# Patient Record
Sex: Male | Born: 1985
Health system: Southern US, Community
[De-identification: ages and names within clinical notes are randomized; demographics above are authoritative.]

## PROBLEM LIST (undated history)

## (undated) DIAGNOSIS — B1920 Unspecified viral hepatitis C without hepatic coma: Secondary | ICD-10-CM

## (undated) DIAGNOSIS — F1111 Opioid abuse, in remission: Secondary | ICD-10-CM

---

## 2017-08-22 ENCOUNTER — Emergency Department (HOSPITAL_BASED_OUTPATIENT_CLINIC_OR_DEPARTMENT_OTHER)
Admission: EM | Admit: 2017-08-22 | Discharge: 2017-08-22 | Disposition: A | Discharge status: Home / Self Care | Payer: Self-pay | Attending: Emergency Medicine | Admitting: Emergency Medicine

## 2017-08-22 ENCOUNTER — Other Ambulatory Visit: Payer: Self-pay

## 2017-08-22 ENCOUNTER — Encounter (HOSPITAL_BASED_OUTPATIENT_CLINIC_OR_DEPARTMENT_OTHER): Payer: Self-pay | Admitting: *Deleted

## 2017-08-22 DIAGNOSIS — K0889 Other specified disorders of teeth and supporting structures: Secondary | ICD-10-CM | POA: Insufficient documentation

## 2017-08-22 DIAGNOSIS — F17200 Nicotine dependence, unspecified, uncomplicated: Secondary | ICD-10-CM | POA: Insufficient documentation

## 2017-08-22 MED ORDER — PENICILLIN V POTASSIUM 500 MG PO TABS: 500.0000 mg | ORAL_TABLET | Freq: Three times a day (TID) | ORAL | 0 refills | Status: AC

## 2017-08-22 MED FILL — PENICILLIN VK 500 MG TABLET: 500 | 7 days supply | Qty: 21 | Fill #0

## 2017-08-22 NOTE — ED Triage Notes (Signed)
Pt reports right lower dental pain for years, worse since last night, "stabbing pain". Denies fevers or any other c/o

## 2017-08-22 NOTE — ED Provider Notes (Signed)
[facility] EMERGENCY DEPARTMENT Provider Note   CSN: [id number] Arrival date & time: 08/22/17  1023     History   Chief Complaint Chief Complaint  Patient presents with  . Dental Pain    HPI Darin Larson is a 32 y.o. male.  Patient presents with complaint of worsening dental pain starting last night.  Pain is much improved with ibuprofen.  Patient denies any facial swelling, difficulty breathing, or difficulty swallowing.  Pain radiates to the ear.  No fevers.  Patient is currently in rehab.  He has had dental pain in this area before, intermittently over the past several years.  He has a history of dental extractions.     History reviewed. No pertinent past medical history.  Patient Active Problem List   Diagnosis Date Noted  . Pain, dental 08/22/2017    History reviewed. No pertinent surgical history.      Home Medications    Prior to Admission medications   Medication Sig Start Date End Date Taking? Authorizing Provider  penicillin v potassium (VEETID) 500 MG tablet Take 1 tablet (500 mg total) by mouth 3 (three) times daily. 08/22/17   Renne CriglerGeiple, Promiss Labarbera, PA-C    Family History History reviewed. No pertinent family history.  Social History Social History   Tobacco Use  . Smoking status: Current Every Day Smoker  . Smokeless tobacco: Never Used  Substance Use Topics  . Alcohol use: Not on file  . Drug use: Not on file     Allergies   Patient has no known allergies.   Review of Systems Review of Systems  Constitutional: Negative for fever.  HENT: Positive for dental problem and ear pain. Negative for facial swelling, sore throat and trouble swallowing.   Respiratory: Negative for shortness of breath and stridor.   Musculoskeletal: Negative for neck pain.  Skin: Negative for color change.  Neurological: Negative for headaches.     Physical Exam Updated Vital Signs BP 116/73 (BP Location: Left Arm)   Pulse (!) 57   Temp 98.1 F  (36.7 C) (Oral)   Resp 18   Ht 5\' 11"  (1.803 m)   Wt 83.9 kg   SpO2 100%   BMI 25.80 kg/m   Physical Exam  Constitutional: He appears well-developed and well-nourished.  HENT:  Head: Normocephalic and atraumatic.  Right Ear: Tympanic membrane, external ear and ear canal normal.  Left Ear: Tympanic membrane, external ear and ear canal normal.  Nose: Nose normal.  Mouth/Throat: Uvula is midline, oropharynx is clear and moist and mucous membranes are normal. No trismus in the jaw. Abnormal dentition. Dental caries present. No dental abscesses or uvula swelling. No tonsillar abscesses.  Patient with dental carry noted in tooth #31.  There is associated mild swelling and erythema of the gumline without gross abscess.  Eyes: Pupils are equal, round, and reactive to light.  Neck: Normal range of motion. Neck supple.  No neck swelling or Lugwig's angina  Neurological: He is alert.  Skin: Skin is warm and dry.  Psychiatric: He has a normal mood and affect.  Nursing note and vitals reviewed.    ED Treatments / Results  Labs (all labs ordered are listed, but only abnormal results are displayed) Labs Reviewed - No data to display  EKG None  Radiology No results found.  Procedures Procedures (including critical care time)  Medications Ordered in ED Medications - No data to display   Initial Impression / Assessment and Plan / ED Course  I have  reviewed the triage vital signs and the nursing notes.  Pertinent labs & imaging results that were available during my care of the patient were reviewed by me and considered in my medical decision making (see chart for details).     11:25 AM Patient seen and examined. Will start on penicillin. No allergy.   Vital signs reviewed and are as follows: BP 116/73 (BP Location: Left Arm)   Pulse (!) 57   Temp 98.1 F (36.7 C) (Oral)   Resp 18   Ht 5\' 11"  (1.803 m)   Wt 83.9 kg   SpO2 100%   BMI 25.80 kg/m   Patient counseled to  take prescribed medications as directed, return with worsening facial or neck swelling, and to follow-up with their dentist as soon as possible.    Final Clinical Impressions(s) / ED Diagnoses   Final diagnoses:  Toothache   Patient with toothache. No fever. Exam unconcerning for Ludwig's angina or other deep tissue infection in neck.   As there is gum swelling, erythema will treat with antibiotic. Urged patient to follow-up with dentist.     ED Discharge Orders         Ordered    penicillin v potassium (VEETID) 500 MG tablet  3 times daily     08/22/17 1118           Renne CriglerGeiple, Randee Upchurch, PA-C 08/22/17 1125    Sabas SousBero, Michael M, MD 08/22/17 1220

## 2017-08-22 NOTE — Discharge Instructions (Signed)
Please read and follow all provided instructions.  Your diagnoses today include:  1. Toothache    The exam and treatment you received today has been provided on an emergency basis only. This is not a substitute for complete medical or dental care.  Tests performed today include:  Vital signs. See below for your results today.   Medications prescribed:   Penicillin - antibiotic  You have been prescribed an antibiotic medicine: take the entire course of medicine even if you are feeling better. Stopping early can cause the antibiotic not to work.  Take any prescribed medications only as directed.  Home care instructions:  Follow any educational materials contained in this packet.  Follow-up instructions: Please follow-up with your dentist for further evaluation of your symptoms.   Dental Assistance: See attached dental referrals  Return instructions:   Please return to the Emergency Department if you experience worsening symptoms.  Please return if you develop a fever, you develop more swelling in your face or neck, you have trouble breathing or swallowing food.  Please return if you have any other emergent concerns.  Additional Information:  Your vital signs today were: BP 116/73 (BP Location: Left Arm)    Pulse (!) 57    Temp 98.1 F (36.7 C) (Oral)    Resp 18    Ht 5\' 11"  (1.803 m)    Wt 83.9 kg    SpO2 100%    BMI 25.80 kg/m  If your blood pressure (BP) was elevated above 135/85 this visit, please have this repeated by your doctor within one month. --------------

## 2018-04-22 ENCOUNTER — Ambulatory Visit: Payer: Self-pay | Admitting: Family Medicine

## 2018-07-17 ENCOUNTER — Other Ambulatory Visit: Payer: Self-pay

## 2018-07-17 ENCOUNTER — Emergency Department (HOSPITAL_COMMUNITY)
Admission: EM | Admit: 2018-07-17 | Discharge: 2018-07-18 | Disposition: A | Discharge status: Home / Self Care | Payer: Self-pay | Attending: Emergency Medicine | Admitting: Emergency Medicine

## 2018-07-17 DIAGNOSIS — L0291 Cutaneous abscess, unspecified: Secondary | ICD-10-CM

## 2018-07-17 DIAGNOSIS — L739 Follicular disorder, unspecified: Secondary | ICD-10-CM

## 2018-07-17 DIAGNOSIS — L662 Folliculitis decalvans: Secondary | ICD-10-CM | POA: Insufficient documentation

## 2018-07-17 DIAGNOSIS — L02511 Cutaneous abscess of right hand: Secondary | ICD-10-CM | POA: Insufficient documentation

## 2018-07-17 DIAGNOSIS — F17210 Nicotine dependence, cigarettes, uncomplicated: Secondary | ICD-10-CM | POA: Insufficient documentation

## 2018-07-17 MED ORDER — LIDOCAINE HCL 2 % IJ SOLN
10.0000 mL | Freq: Once | INTRAMUSCULAR | Status: AC
  Administered 2018-07-18: 200 mg
  Filled 2018-07-17: qty 20

## 2018-07-17 NOTE — ED Triage Notes (Signed)
Pt arrived stating he has been fishing and roughly 4-5 days day ago noticed a rash on his hand, it has become hard and hot to touch. 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 Pt has red streak moving up arm from wound.

## 2018-07-17 NOTE — ED Notes (Signed)
I&D at bedside for provider

## 2018-07-17 NOTE — ED Provider Notes (Signed)
[facility] DEPT Provider Note   CSN: [id number] Arrival date & time: 07/17/18  1932    History   Chief Complaint Chief Complaint  Patient presents with  . Rash    Possible MRSA    HPI Darin Larson is a 33 y.o. male.     Patient presents with complaints of a rash on his left hand and wrist.  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  He started getting small bumps on his hand which became larger, more red and swollen.  He reports that he has to wear gloves when he has blistering and this irritates his hands.  Over the last 24 hours an area on his left wrist has become very swollen, tender and hard.  This evening he started to notice some red streaking going up his forearm.  He has not had any fever.     No past medical history on file.  Patient Active Problem List   Diagnosis Date Noted  . Pain, dental 08/22/2017    No past surgical history on file.      Home Medications    Prior to Admission medications   Medication Sig Start Date End Date Taking? Authorizing Provider  penicillin v potassium (VEETID) 500 MG tablet Take 1 tablet (500 mg total) by mouth 3 (three) times daily. 08/22/17   Carlisle Cater, PA-C  sulfamethoxazole-trimethoprim (BACTRIM DS) 800-160 MG tablet Take 1 tablet by mouth 2 (two) times daily. 07/18/18   Orpah Greek, MD    Family History No family history on file.  Social History Social History   Tobacco Use  . Smoking status: Current Every Day Smoker  . Smokeless tobacco: Never Used  Substance Use Topics  . Alcohol use: Not on file  . Drug use: Not on file     Allergies   Patient has no known allergies.   Review of Systems Review of Systems  Constitutional: Negative for fever.  Skin: Positive for rash.     Physical Exam  Updated Vital Signs BP (!) 157/94   Pulse 79   Temp 98.8 F (37.1 C) (Oral)   Resp 15   SpO2 98%   Physical Exam Constitutional:      Appearance: Normal appearance.  HENT:     Head: Normocephalic and atraumatic.  Neck:     Musculoskeletal: Neck supple.  Cardiovascular:     Rate and Rhythm: Normal rate and regular rhythm.     Heart sounds: Normal heart sounds.  Pulmonary:     Effort: Pulmonary effort is normal.     Breath sounds: Normal breath sounds.  Abdominal:     Palpations: Abdomen is soft.  Musculoskeletal: Normal range of motion.        General: Swelling (right hand) present.  Skin:    Comments: Multiple papules and pustules on dorsal aspect of right hand. 1.5cm tender nodular eruption ulna aspect of right wrist. Lymphangitis volar aspect of right wrist.  Neurological:     General: No focal deficit present.     Mental Status: He is alert.      ED Treatments / Results  Labs (all labs ordered are listed, but only abnormal results are displayed) Labs Reviewed - No data to display  EKG None  Radiology No results found.  Procedures .Marland KitchenIncision and Drainage  Date/Time: 07/18/2018 12:25 AM Performed by: Gilda CreasePollina,  J, MD Authorized by: Gilda CreasePollina,  J, MD   Consent:    Consent obtained:  Verbal   Consent given by:  Patient   Risks discussed:  Bleeding, incomplete drainage and pain Universal protocol:    Procedure explained and questions answered to patient or proxy's satisfaction: yes     Site/side marked: yes     Immediately prior to procedure a time out was called: yes     Patient identity confirmed:  Verbally with patient Location:    Type:  Abscess   Location:  Upper extremity   Upper extremity location:  Wrist   Wrist location:  R wrist Pre-procedure details:    Skin preparation:  Betadine Anesthesia (see MAR for exact dosages):    Anesthesia method:  Local infiltration   Local anesthetic:  Lidocaine 2% w/o epi Procedure type:     Complexity:  Simple Procedure details:    Needle aspiration: no     Incision types:  Single straight   Scalpel blade:  11   Wound management:  Probed and deloculated   Drainage:  Bloody   Drainage amount:  Scant   Packing materials:  1/4 in iodoform gauze Post-procedure details:    Patient tolerance of procedure:  Tolerated well, no immediate complications   (including critical care time)  Medications Ordered in ED Medications  lidocaine (XYLOCAINE) 2 % (with pres) injection 200 mg (has no administration in time range)  sulfamethoxazole-trimethoprim (BACTRIM DS) 800-160 MG per tablet 2 tablet (has no administration in time range)     Initial Impression / Assessment and Plan / ED Course  I have reviewed the triage vital signs and the nursing notes.  Pertinent labs & imaging results that were available during my care of the patient were reviewed by me and considered in my medical decision making (see chart for details).        Patient with folliculitis on right hand.  There was an abscess on the wrist that was opened here in the ER.  Will initiate p.o. Bactrim.  Final Clinical Impressions(s) / ED Diagnoses   Final diagnoses:  Folliculitis  Abscess    ED Discharge Orders         Ordered    sulfamethoxazole-trimethoprim (BACTRIM DS) 800-160 MG tablet  2 times daily     07/18/18 0028           Gilda CreasePollina,  J, MD 07/18/18 0028

## 2018-07-18 MED ORDER — SULFAMETHOXAZOLE-TRIMETHOPRIM 800-160 MG PO TABS
2.0000 | ORAL_TABLET | Freq: Once | ORAL | Status: AC
  Administered 2018-07-18: 2 via ORAL
  Filled 2018-07-18: qty 2

## 2018-07-18 MED ORDER — SULFAMETHOXAZOLE-TRIMETHOPRIM 800-160 MG PO TABS: 1.0000 | ORAL_TABLET | Freq: Two times a day (BID) | ORAL | 2 refills | Status: AC

## 2019-01-23 ENCOUNTER — Emergency Department (HOSPITAL_COMMUNITY): Payer: PRIVATE HEALTH INSURANCE

## 2019-01-23 ENCOUNTER — Encounter (HOSPITAL_COMMUNITY): Payer: Self-pay | Admitting: Emergency Medicine

## 2019-01-23 ENCOUNTER — Other Ambulatory Visit: Payer: Self-pay

## 2019-01-23 ENCOUNTER — Emergency Department (HOSPITAL_COMMUNITY)
Admission: EM | Admit: 2019-01-23 | Discharge: 2019-01-23 | Disposition: A | Discharge status: Home / Self Care | Payer: PRIVATE HEALTH INSURANCE | Attending: Emergency Medicine | Admitting: Emergency Medicine

## 2019-01-23 DIAGNOSIS — Y9269 Other specified industrial and construction area as the place of occurrence of the external cause: Secondary | ICD-10-CM | POA: Insufficient documentation

## 2019-01-23 DIAGNOSIS — R109 Unspecified abdominal pain: Secondary | ICD-10-CM | POA: Insufficient documentation

## 2019-01-23 DIAGNOSIS — W11XXXA Fall on and from ladder, initial encounter: Secondary | ICD-10-CM | POA: Insufficient documentation

## 2019-01-23 DIAGNOSIS — Z79899 Other long term (current) drug therapy: Secondary | ICD-10-CM | POA: Insufficient documentation

## 2019-01-23 DIAGNOSIS — Y990 Civilian activity done for income or pay: Secondary | ICD-10-CM | POA: Insufficient documentation

## 2019-01-23 DIAGNOSIS — Y93H3 Activity, building and construction: Secondary | ICD-10-CM | POA: Insufficient documentation

## 2019-01-23 DIAGNOSIS — S2241XA Multiple fractures of ribs, right side, initial encounter for closed fracture: Secondary | ICD-10-CM | POA: Insufficient documentation

## 2019-01-23 DIAGNOSIS — S299XXA Unspecified injury of thorax, initial encounter: Secondary | ICD-10-CM | POA: Diagnosis present

## 2019-01-23 DIAGNOSIS — F17210 Nicotine dependence, cigarettes, uncomplicated: Secondary | ICD-10-CM | POA: Insufficient documentation

## 2019-01-23 HISTORY — DX: Opioid abuse, in remission: F11.11

## 2019-01-23 HISTORY — DX: Unspecified viral hepatitis C without hepatic coma: B19.20

## 2019-01-23 LAB — COMPREHENSIVE METABOLIC PANEL
ALT: 35 U/L (ref 0–44)
AST: 37 U/L (ref 15–41)
Albumin: 4.5 g/dL (ref 3.5–5.0)
Alkaline Phosphatase: 52 U/L (ref 38–126)
Anion gap: 12 (ref 5–15)
BUN: 17 mg/dL (ref 6–20)
CO2: 25 mmol/L (ref 22–32)
Calcium: 8.7 mg/dL — ABNORMAL LOW (ref 8.9–10.3)
Chloride: 96 mmol/L — ABNORMAL LOW (ref 98–111)
Creatinine, Ser: 0.97 mg/dL (ref 0.61–1.24)
GFR calc Af Amer: >60 mL/min (ref >60)
GFR calc non Af Amer: >60 mL/min (ref >60)
Glucose, Bld: 113 mg/dL — ABNORMAL HIGH (ref 70–99)
Potassium: 3.3 mmol/L — ABNORMAL LOW (ref 3.5–5.1)
Sodium: 133 mmol/L — ABNORMAL LOW (ref 135–145)
Total Bilirubin: 0.8 mg/dL (ref 0.3–1.2)
Total Protein: 7.1 g/dL (ref 6.5–8.1)

## 2019-01-23 LAB — CBC WITH DIFFERENTIAL/PLATELET
Abs Immature Granulocytes: 0.05 10*3/uL (ref 0.00–0.07)
Basophils Absolute: 0 10*3/uL (ref 0.0–0.1)
Basophils Relative: 0 %
Eosinophils Absolute: 0.1 10*3/uL (ref 0.0–0.5)
Eosinophils Relative: 1 %
HCT: 38.9 % — ABNORMAL LOW (ref 39.0–52.0)
Hemoglobin: 12.8 g/dL — ABNORMAL LOW (ref 13.0–17.0)
Immature Granulocytes: 0 %
Lymphocytes Relative: 22 %
Lymphs Abs: 2.9 10*3/uL (ref 0.7–4.0)
MCH: 28.9 pg (ref 26.0–34.0)
MCHC: 32.9 g/dL (ref 30.0–36.0)
MCV: 87.8 fL (ref 80.0–100.0)
Monocytes Absolute: 1.1 10*3/uL — ABNORMAL HIGH (ref 0.1–1.0)
Monocytes Relative: 9 %
Neutro Abs: 8.9 10*3/uL — ABNORMAL HIGH (ref 1.7–7.7)
Neutrophils Relative %: 68 %
Platelets: 217 10*3/uL (ref 150–400)
RBC: 4.43 MIL/uL (ref 4.22–5.81)
RDW: 14 % (ref 11.5–15.5)
WBC: 13.1 10*3/uL — ABNORMAL HIGH (ref 4.0–10.5)
nRBC: 0 % (ref 0.0–0.2)

## 2019-01-23 LAB — LIPASE, BLOOD: Lipase: 26 U/L (ref 11–51)

## 2019-01-23 LAB — SAMPLE TO BLOOD BANK

## 2019-01-23 MED ORDER — MORPHINE SULFATE (PF) 4 MG/ML IV SOLN
4.0000 mg | Freq: Once | INTRAVENOUS | Status: AC
  Administered 2019-01-23: 4 mg via INTRAVENOUS
  Filled 2019-01-23: qty 1

## 2019-01-23 MED ORDER — SODIUM CHLORIDE 0.9 % IV BOLUS
1000.0000 mL | Freq: Once | INTRAVENOUS | Status: AC
  Administered 2019-01-23: 1000 mL via INTRAVENOUS

## 2019-01-23 MED ORDER — IOHEXOL 300 MG/ML  SOLN
100.0000 mL | Freq: Once | INTRAMUSCULAR | Status: AC | PRN
  Administered 2019-01-23: 16:00:00 100 mL via INTRAVENOUS

## 2019-01-23 MED ORDER — OXYCODONE-ACETAMINOPHEN 5-325 MG PO TABS: 1.0000 | ORAL_TABLET | ORAL | 0 refills | Status: DC | PRN

## 2019-01-23 MED ORDER — OXYCODONE-ACETAMINOPHEN 5-325 MG PO TABS: 1.0000 | ORAL_TABLET | Freq: Four times a day (QID) | ORAL | 0 refills | Status: AC | PRN

## 2019-01-23 MED ORDER — LIDOCAINE 5 % EX PTCH
1.0000 | MEDICATED_PATCH | CUTANEOUS | Status: DC
  Administered 2019-01-23: 18:00:00 1 via TRANSDERMAL
  Filled 2019-01-23: qty 1

## 2019-01-23 MED ORDER — SODIUM CHLORIDE (PF) 0.9 % IJ SOLN
INTRAMUSCULAR | Status: AC
  Filled 2019-01-23: qty 50

## 2019-01-23 NOTE — ED Provider Notes (Signed)
[facility]-EMERGENCY DEPT Provider Note   CSN: [id number] Arrival date & time: 01/23/19  1224     History Chief Complaint  Patient presents with  . Fall  . rib cage pain    right    Darin Larson is a 34 y.o. male with a past medical history of hepatitis C, opioid abuse in remission, who presents today for evaluation after a fall.  He reports that he was at work on a ladder and his feet were about 6 feet off the ground when he had a mechanical fall landing on his right sided ribs on the corner of a concrete step.  He denies striking his head or passing out.  No nausea or vomiting prior to arrival.  He states that he initially felt like he "got the wind knocked out" of him.  He states that he is not short of breath at this time.  No interventions tried prior to arrival.  His pain worsens with movement and palpation.  He also reports that he has a "crick" in his neck.  He does not take any blood thinning medications.  He denies any pains in his bilateral arms or legs.  HPI     Past Medical History:  Diagnosis Date  . Hepatitis C   . Opioid abuse, in remission [facility])     Patient Active Problem List   Diagnosis Date Noted  . Pain, dental 08/22/2017    History reviewed. No pertinent surgical history.     No family history on file.  Social History   Tobacco Use  . Smoking status: Current Every Day Smoker  . Smokeless tobacco: Never Used  Substance Use Topics  . Alcohol use: Not on file  . Drug use: Not on file    Home Medications Prior to Admission medications   Medication Sig Start Date End Date Taking? Authorizing Provider  oxyCODONE-acetaminophen (PERCOCET/ROXICET) 5-325 MG tablet Take 1 tablet by mouth every 6 (six) hours as needed for severe pain. 01/23/19   Cristina Gong, PA-C  penicillin v potassium (VEETID) 500 MG tablet Take 1 tablet (500 mg total) by mouth 3 (three) times daily. Patient not taking: Reported on 07/18/2018 08/22/17    Renne Crigler, PA-C  sulfamethoxazole-trimethoprim (BACTRIM DS) 800-160 MG tablet Take 1 tablet by mouth 2 (two) times daily. 07/18/18   Gilda Crease, MD    Allergies    Patient has no known allergies.  Review of Systems   Review of Systems  Constitutional: Negative for chills and fever.  HENT: Negative for congestion.   Eyes: Negative for visual disturbance.  Respiratory: Positive for shortness of breath (Resolved). Negative for chest tightness.   Cardiovascular: Positive for chest pain. Negative for palpitations and leg swelling.  Gastrointestinal: Negative for abdominal pain, diarrhea, nausea and vomiting.  Musculoskeletal: Positive for neck pain.  Skin: Negative for color change, rash and wound.  Neurological: Negative for dizziness, facial asymmetry, weakness and headaches.  Psychiatric/Behavioral: Negative for confusion.  All other systems reviewed and are negative.   Physical Exam Updated Vital Signs BP (!) 141/82 (BP Location: Right Arm)   Pulse 70   Temp 98.8 F (37.1 C) (Oral)   Resp 16   Ht 5\' 11"  (1.803 m)   Wt 81.6 kg   SpO2 99%   BMI 25.10 kg/m   Physical Exam Vitals and nursing note reviewed.  Constitutional:      General: He is not in acute distress.    Appearance: He is well-developed.  He is not ill-appearing.  HENT:     Head: Normocephalic and atraumatic.     Comments: No raccoon's eyes or battle signs bilaterally.    Nose: Nose normal.     Mouth/Throat:     Mouth: Mucous membranes are moist.  Eyes:     Conjunctiva/sclera: Conjunctivae normal.  Neck:     Comments: Diffuse paraspinal tenderness, primarily on the left side.  ROM not initially tested.  Once CT scans returned patient has full intact range of motion. Cardiovascular:     Rate and Rhythm: Normal rate and regular rhythm.     Heart sounds: No murmur.  Pulmonary:     Effort: Pulmonary effort is normal. No respiratory distress.     Breath sounds: Normal breath sounds.  Chest:       Chest wall: Tenderness (Right lower lateral chest) present. No deformity or crepitus.     Comments: There is ecchymosis across the right lateral lower chest/upper abdomen. Abdominal:     General: Abdomen is flat.     Palpations: Abdomen is soft.     Tenderness: There is abdominal tenderness (Right sided lateral abdomen). There is no guarding or rebound.  Musculoskeletal:     Comments: No crepitus or deformities in bilateral arms and legs.  Skin:    General: Skin is warm and dry.  Neurological:     General: No focal deficit present.     Mental Status: He is alert and oriented to person, place, and time.     Cranial Nerves: No cranial nerve deficit.     Motor: No weakness.     Gait: Gait normal.  Psychiatric:        Mood and Affect: Mood normal.        Behavior: Behavior normal.     ED Results / Procedures / Treatments   Labs (all labs ordered are listed, but only abnormal results are displayed) Labs Reviewed  COMPREHENSIVE METABOLIC PANEL - Abnormal; Notable for the following components:      Result Value   Sodium 133 (*)    Potassium 3.3 (*)    Chloride 96 (*)    Glucose, Bld 113 (*)    Calcium 8.7 (*)    All other components within normal limits  CBC WITH DIFFERENTIAL/PLATELET - Abnormal; Notable for the following components:   WBC 13.1 (*)    Hemoglobin 12.8 (*)    HCT 38.9 (*)    Neutro Abs 8.9 (*)    Monocytes Absolute 1.1 (*)    All other components within normal limits  LIPASE, BLOOD  SAMPLE TO BLOOD BANK    EKG None  Radiology DG Ribs Unilateral W/Chest Right  Result Date: 01/23/2019 CLINICAL DATA:  Right rib pain after fall. EXAM: RIGHT RIBS AND CHEST - 3+ VIEW COMPARISON:  None. FINDINGS: Minimally displaced fracture is seen involving the lateral portion of right ninth rib. There is no evidence of pneumothorax or pleural effusion. Both lungs are clear. Heart size and mediastinal contours are within normal limits. IMPRESSION: Minimally displaced right  ninth rib fracture. No acute cardiopulmonary abnormality seen. No pneumothorax or pleural effusion is noted. Electronically Signed   By: Lupita RaiderJames  Green Jr M.D.   On: 01/23/2019 12:54   CT Chest W Contrast  Result Date: 01/23/2019 CLINICAL DATA:  Fall from ladder. Fall from 6 feet fall onto side and central blocks short of breath. EXAM: CT CHEST, ABDOMEN, AND PELVIS WITH CONTRAST TECHNIQUE: Multidetector CT imaging of the chest, abdomen and pelvis was  performed following the standard protocol during bolus administration of intravenous contrast. CONTRAST:  OMNIPAQUE IOHEXOL 300 MG/ML  SOLN COMPARISON:  None. FINDINGS: CT CHEST FINDINGS Cardiovascular: No contour abnormality of the aorta to suggest dissection or transsection. Great vessels are normal. Mediastinum/Nodes: Small amount of triangular residual thymus in the anterior mediastinum. No mediastinal hematoma. No pericardial fluid. Esophagus normal. Lungs/Pleura: No pneumothorax. No pulmonary contusion. Mild basilar atelectasis. Trachea and airways normal. Musculoskeletal: There is a fracture of the anteromedial RIGHT eighth rib (image 69/2). Fracture is displaced 1 bone width. Fracture seen on coronal image 47/4. Nondisplaced fracture of the ninth rib (image 37/5, sagittal). CT ABDOMEN AND PELVIS FINDINGS Hepatobiliary: No hepatic laceration. Anterior RIGHT rib fracture adjacent to the liver; however, there is no evidence of traumatic injury to the liver. No laceration. No subcapsular hematoma. Gallbladder intact Pancreas: Pancreas is normal. No ductal dilatation. No pancreatic inflammation. Spleen: No splenic laceration. Adrenals/urinary tract: Adrenal glands normal. Kidneys enhance symmetrically. Bladder intact Stomach/Bowel: Stomach, small bowel, appendix, and cecum are normal. The colon and rectosigmoid colon are normal. Vascular/Lymphatic: Abdominal aorta normal caliber. No aortic injury. Iliac arteries are normal. Reproductive: Prostate normal  Other: No free fluid the abdomen pelvis. No free fluid the at the mesentery. Musculoskeletal: No pelvic fracture spine fracture. IMPRESSION: 1. Displaced anteromedial RIGHT eighth rib fracture. 2. Nondisplaced fracture of the RIGHT ninth rib. 3. No pneumothorax. 4. No vascular injury. 5. No solid organ injury in the abdomen pelvis. 6. No pelvic fracture or spine fracture. Electronically Signed   By: Genevive Bi M.D.   On: 01/23/2019 16:33   CT Cervical Spine Wo Contrast  Result Date: 01/23/2019 CLINICAL DATA:  Fall from a ladder about 1 hour prior to admission. Rib pain. EXAM: CT CERVICAL SPINE WITHOUT CONTRAST TECHNIQUE: Multidetector CT imaging of the cervical spine was performed without intravenous contrast. Multiplanar CT image reconstructions were also generated. COMPARISON:  None. FINDINGS: Alignment: Unremarkable Skull base and vertebrae: No fracture or acute bony findings involving the cervical spine. Incidental hemangioma in the C6 vertebral body. Soft tissues and spinal canal: Small cervical lymph nodes are not pathologically enlarged by size criteria. Disc levels:  No impingement identified. Upper chest: Unremarkable Other: No supplemental non-categorized findings. IMPRESSION: 1. No significant cervical spine findings are observed. 2. Incidental hemangioma in the C6 vertebral body. Electronically Signed   By: Gaylyn Rong M.D.   On: 01/23/2019 16:20   CT Abdomen Pelvis W Contrast  Result Date: 01/23/2019 CLINICAL DATA:  Fall from ladder. Fall from 6 feet fall onto side and central blocks short of breath. EXAM: CT CHEST, ABDOMEN, AND PELVIS WITH CONTRAST TECHNIQUE: Multidetector CT imaging of the chest, abdomen and pelvis was performed following the standard protocol during bolus administration of intravenous contrast. CONTRAST:  OMNIPAQUE IOHEXOL 300 MG/ML  SOLN COMPARISON:  None. FINDINGS: CT CHEST FINDINGS Cardiovascular: No contour abnormality of the aorta to suggest dissection  or transsection. Great vessels are normal. Mediastinum/Nodes: Small amount of triangular residual thymus in the anterior mediastinum. No mediastinal hematoma. No pericardial fluid. Esophagus normal. Lungs/Pleura: No pneumothorax. No pulmonary contusion. Mild basilar atelectasis. Trachea and airways normal. Musculoskeletal: There is a fracture of the anteromedial RIGHT eighth rib (image 69/2). Fracture is displaced 1 bone width. Fracture seen on coronal image 47/4. Nondisplaced fracture of the ninth rib (image 37/5, sagittal). CT ABDOMEN AND PELVIS FINDINGS Hepatobiliary: No hepatic laceration. Anterior RIGHT rib fracture adjacent to the liver; however, there is no evidence of traumatic injury to the liver.  No laceration. No subcapsular hematoma. Gallbladder intact Pancreas: Pancreas is normal. No ductal dilatation. No pancreatic inflammation. Spleen: No splenic laceration. Adrenals/urinary tract: Adrenal glands normal. Kidneys enhance symmetrically. Bladder intact Stomach/Bowel: Stomach, small bowel, appendix, and cecum are normal. The colon and rectosigmoid colon are normal. Vascular/Lymphatic: Abdominal aorta normal caliber. No aortic injury. Iliac arteries are normal. Reproductive: Prostate normal Other: No free fluid the abdomen pelvis. No free fluid the at the mesentery. Musculoskeletal: No pelvic fracture spine fracture. IMPRESSION: 1. Displaced anteromedial RIGHT eighth rib fracture. 2. Nondisplaced fracture of the RIGHT ninth rib. 3. No pneumothorax. 4. No vascular injury. 5. No solid organ injury in the abdomen pelvis. 6. No pelvic fracture or spine fracture. Electronically Signed   By: Suzy Bouchard M.D.   On: 01/23/2019 16:33    Procedures Procedures (including critical care time)  Medications Ordered in ED Medications  sodium chloride (PF) 0.9 % injection (has no administration in time range)  lidocaine (LIDODERM) 5 % 1 patch (1 patch Transdermal Patch Applied 01/23/19 1738)  sodium chloride  0.9 % bolus 1,000 mL (0 mLs Intravenous Stopped 01/23/19 1651)  morphine 4 MG/ML injection 4 mg (4 mg Intravenous Given 01/23/19 1450)  sodium chloride 0.9 % bolus 1,000 mL (0 mLs Intravenous Stopped 01/23/19 1741)  iohexol (OMNIPAQUE) 300 MG/ML solution 100 mL (100 mLs Intravenous Contrast Given 01/23/19 1557)    ED Course  I have reviewed the triage vital signs and the nursing notes.  Pertinent labs & imaging results that were available during my care of the patient were reviewed by me and considered in my medical decision making (see chart for details).  Clinical Course as of Jan 22 1914  Fri Jan 23, 2019  1504 RN informed that patient vomited.  He does not want meds at this time.  Will monitor.    [EH]  1510 Fluid bolus ordered.  He is not tachycardic and does not take any betablockers so lower suspiction for hemorrhagic shock causing BP to drop. RN to re-check in 15 minutes.   BP(!): 103/44 [EH]    Clinical Course User Index [EH] Ollen Gross   MDM Rules/Calculators/A&P                     Patient presents today for evaluation of a fall off a ladder.  His feet were approximately 6 feet off the ground when the ladder gave out causing him to fall striking the right side of his chest on the ground.  X-rays were obtained showing concern for a right-sided rib fracture.  On exam he did have ecchymosis over the right sided lower chest/upper abdomen and tenderness on the right lateral abdomen.  Given the significant mechanism CT abdomen and pelvis were obtained showing right sided eighth and ninth rib fractures without evidence of underlying intra-abdominal, intrapelvic, or intrathoracic injury.  No pneumothorax, hemothorax, liver laceration or hematoma.  Patient and I discussed indications for CT scan of his head.  He did not strike his head, pass out, does not take any blood thinning use and is adamant that he does not have a headache or need his head scanned.  Given his reported  neck pain CT scan of his neck was obtained without evidence of fracture or other acute abnormality.  While in the emergency room patient's pain was treated with morphine and he was given IV fluid bolus.  He remained hemodynamically stable while in my care.  Patient does have a history of opioid abuse.  He and I had a open and frank discussion about using narcotic pain medication in the setting of an acute painful injury, along with the risks of relapse.  He states that he lives in a house where he is not allowed to have narcotic pain medications and therefore will be giving them to his significant other who he states he trusts not to misuse or divert them.  He also states his understanding of the possible risk of recurrent addiction stating "I don't ever want to be like that again."  We discussed multiple nonopioid options for pain control including lidocaine patch, NSAIDs, and Tylenol.  While his LFTs are okay today based on his history of hepatitis C overall Tylenol dose was lowered.  Patient does have an acutely painful injury (2 rib fractures) that if not appropriately managed can cause complications such as pneumonia.  After this discussion of the risks and benefits he is given a prescription for Percocet in accordance with [place] stop act.  He is also given an incentive spirometer.  Discussed that any additional/unneeded oxycodone needs to be appropriately disposed of and he states his understanding.    Return precautions were discussed with patient who states their understanding.  At the time of discharge patient denied any unaddressed complaints or concerns.  Patient is agreeable for discharge home.  Note: Portions of this report may have been transcribed using voice recognition software. 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

## 2019-01-23 NOTE — ED Triage Notes (Signed)
Pt fell from small ladder about an hour ago. C/o right rib cage pains. Feels SOb with speaking.

## 2019-01-23 NOTE — ED Notes (Signed)
Patient transported to CT 

## 2019-01-23 NOTE — Discharge Instructions (Addendum)
You may take Aleve to help with your pain.  Please take 2 tablets of Aleve (or generic naproxen sodium ) every 12 hours as needed.  You make take tylenol, up to 500 mg (one extra strength pill).  Do not take more than 2,000 mg tylenol in a 24 hour period.  Please check all medication labels as many medications such as pain and cold medications may contain tylenol.  Do not drink alcohol while taking these medications.  While you are taking Aleve do not take other NSAID medications, such as ibuprofen, Advil, Motrin, or Voltaren.  You were also given a lidocaine patch today.  You may purchase these at the drugstore and use them as directed.  Typically these need to be removed after 12 hours and have you wait 12 hours before you put a new patch on.  Today you received medications that may make you sleepy or impair your ability to make decisions.  For the next 24 hours please do not drive, WORK AT HEIGHTS, operate heavy machinery, care for a small child with out another adult present, or perform any activities that may cause harm to you or someone else if you were to fall asleep or be impaired.   You are being prescribed a medication which may make you sleepy. Please follow up of listed precautions for at least 24 hours after taking one dose.

## 2020-10-21 IMAGING — CT CT CERVICAL SPINE W/O CM
3 of 4 series · 12 of 33 positions shown, 14 images · non-contrast
Comparison: None.

CLINICAL DATA: Fall from a ladder about 1 hour prior to admission.
Rib pain.

EXAM:
CT CERVICAL SPINE WITHOUT CONTRAST
TECHNIQUE: Multidetector CT imaging of the cervical spine was performed without
intravenous contrast. Multiplanar CT image reconstructions were also
generated.

[Series 7: orthogonal bone · axial · 0.23mm/px · z∈[-367,-219]mm · 4 of 113 slices shown, 5 images]
[im 19/113  soft-tissue]
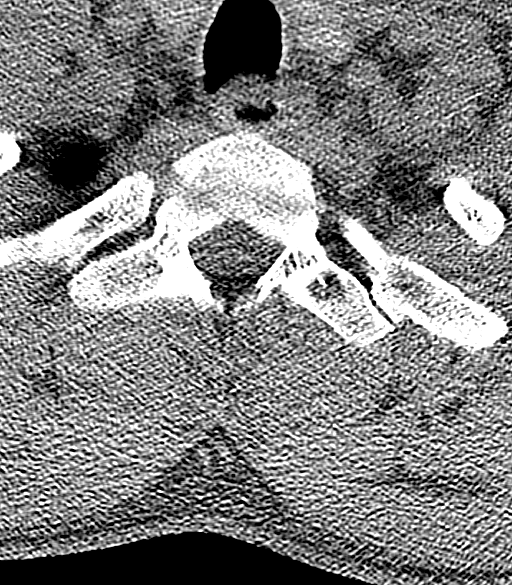
[im 19/113  bone]
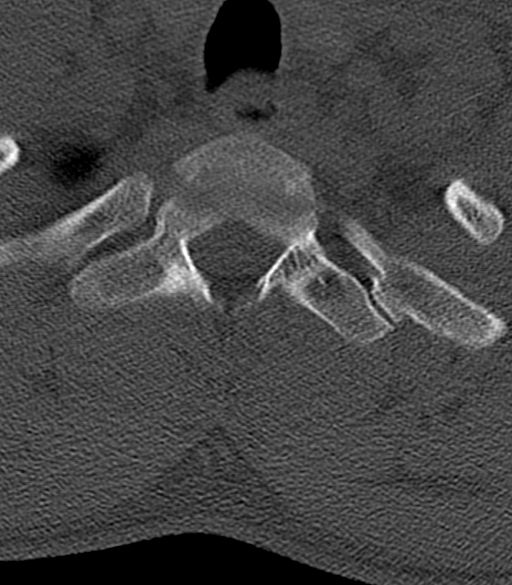
[im 38/113  bone]
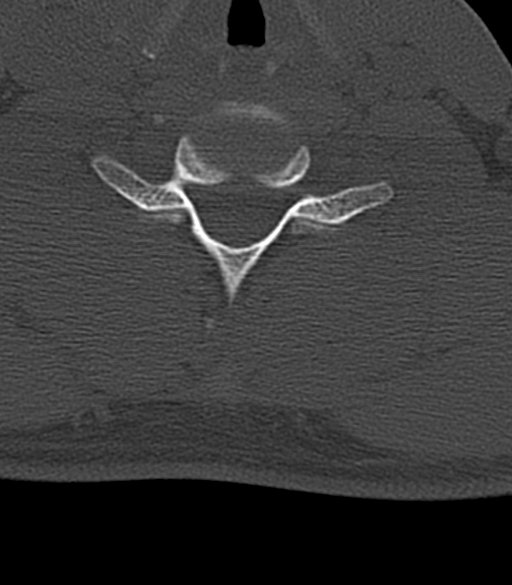
[im 75/113  bone]
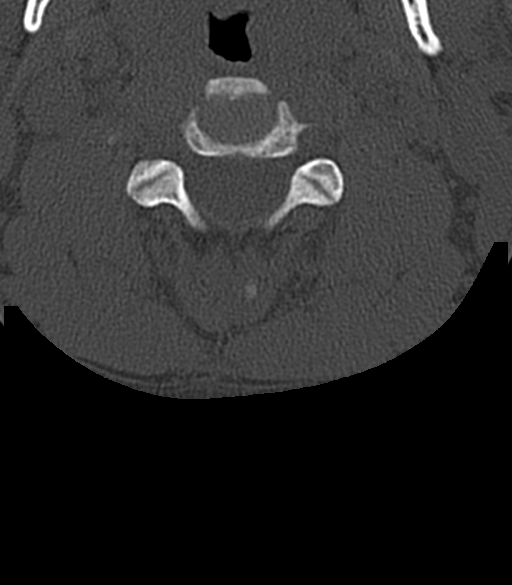
[im 94/113  bone]
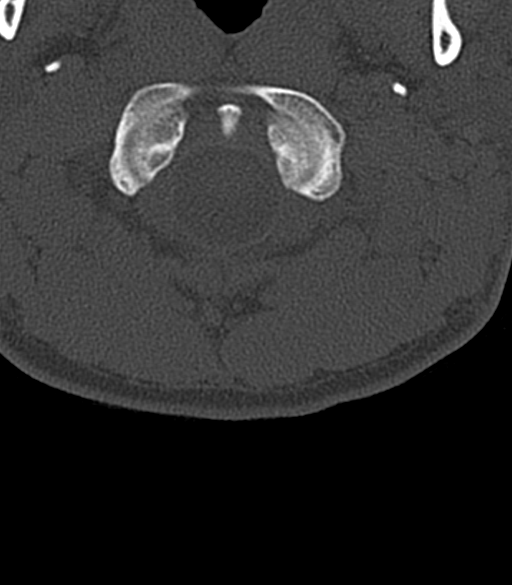

[Series 8: coronal bone · coronal · 0.29mm/px · 3 of 64 slices shown]
[im 13/64  bone]
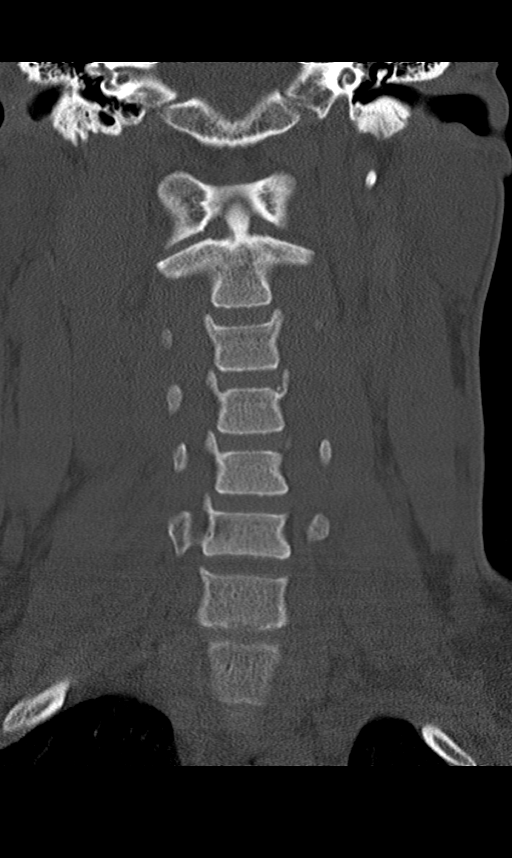
[im 26/64  bone]
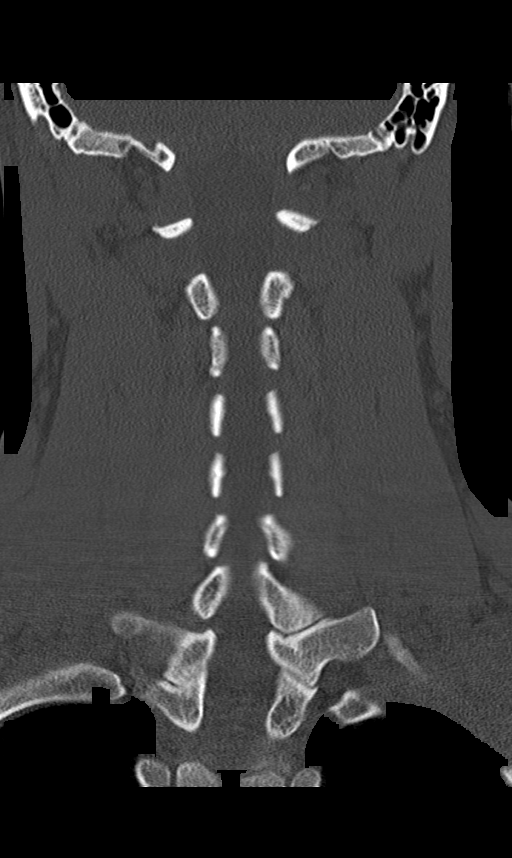
[im 38/64  bone]
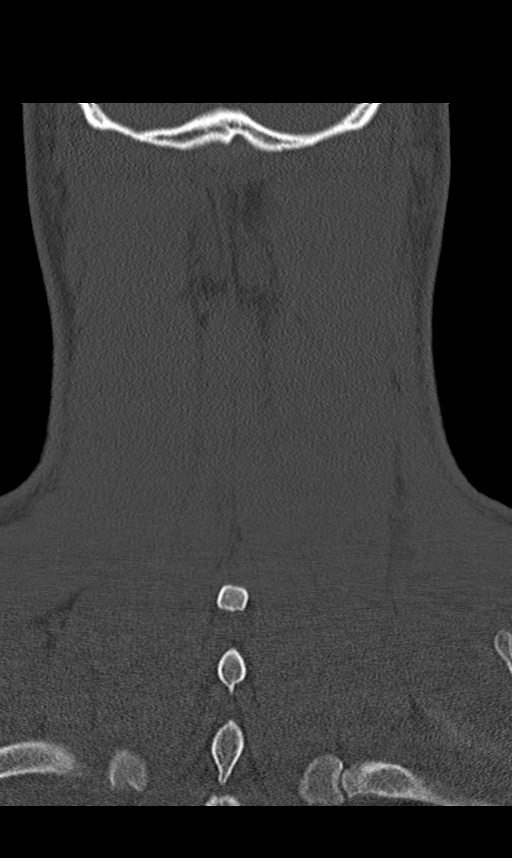

[Series 9: sagittal bone · sagittal · 0.29mm/px · 5 of 61 slices shown, 6 images]
[im 21/61  bone]
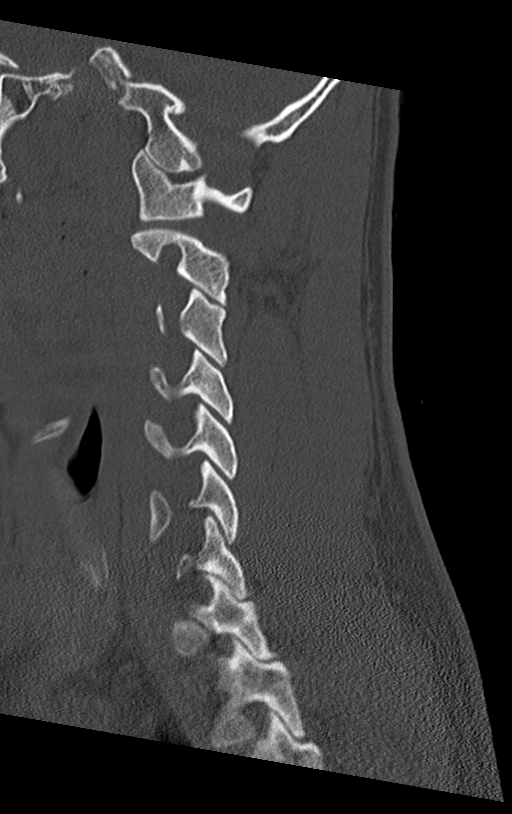
[im 26/61  bone]
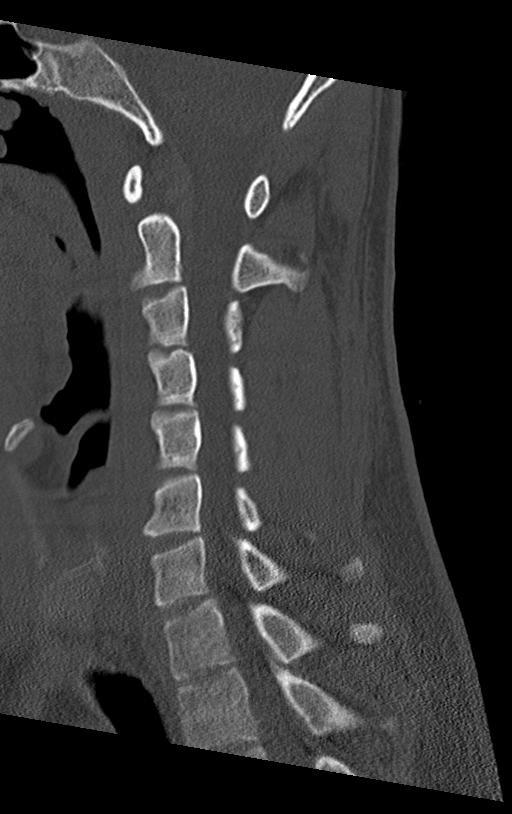
[im 31/61  soft-tissue]
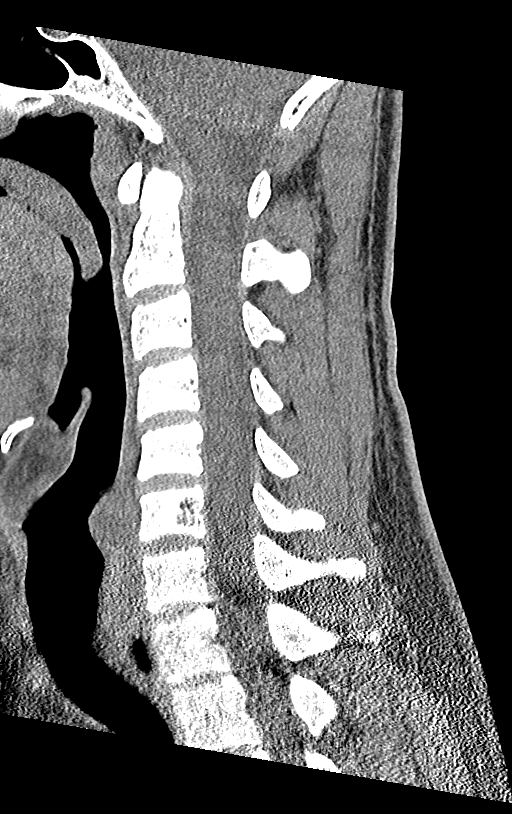
[im 31/61  bone]
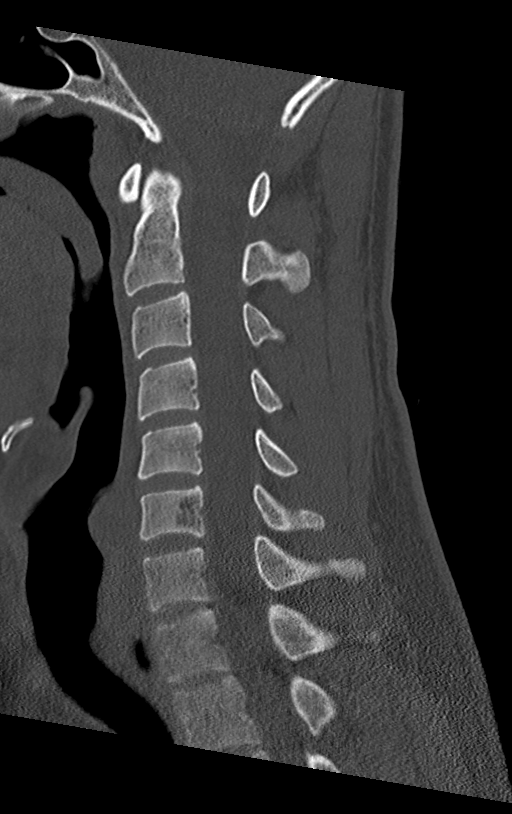
[im 36/61  bone]
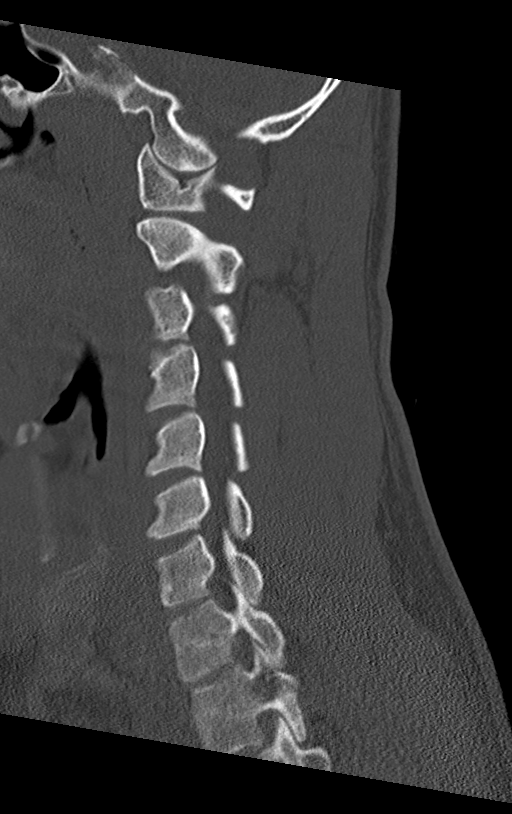
[im 41/61  bone]
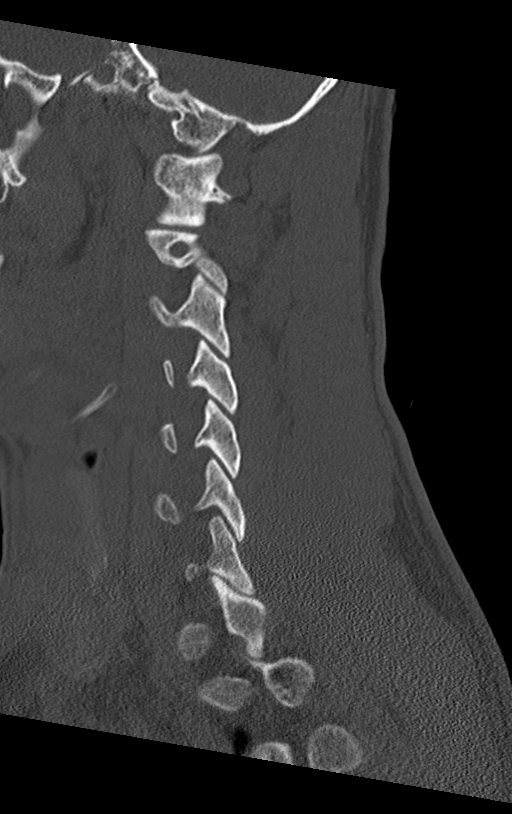

[12 of 33 positions shown; findings below may reference images not displayed]

FINDINGS: Alignment: Unremarkable

Skull base and vertebrae: No fracture or acute bony findings
involving the cervical spine. Incidental hemangioma in the C6
vertebral body.

Soft tissues and spinal canal: Small cervical lymph nodes are not
pathologically enlarged by size criteria.

Disc levels:  No impingement identified.

Upper chest: Unremarkable

Other: No supplemental non-categorized findings.
IMPRESSION: 1. No significant cervical spine findings are observed.
2. Incidental hemangioma in the C6 vertebral body.
